# Patient Record
Sex: Female | Born: 1980 | Race: Black or African American | Hispanic: No | Marital: Single | Smoking: Current some day smoker
Health system: Southern US, Community
[De-identification: ages and names within clinical notes are randomized; demographics above are authoritative.]

## PROBLEM LIST (undated history)

## (undated) HISTORY — PX: FRACTURE SURGERY: SHX138

---

## 2015-03-19 DIAGNOSIS — R5383 Other fatigue: Secondary | ICD-10-CM | POA: Diagnosis not present

## 2015-03-19 DIAGNOSIS — G43829 Menstrual migraine, not intractable, without status migrainosus: Secondary | ICD-10-CM | POA: Diagnosis not present

## 2015-04-08 DIAGNOSIS — R5383 Other fatigue: Secondary | ICD-10-CM | POA: Diagnosis not present

## 2015-04-08 DIAGNOSIS — E559 Vitamin D deficiency, unspecified: Secondary | ICD-10-CM | POA: Diagnosis not present

## 2015-04-08 DIAGNOSIS — G43829 Menstrual migraine, not intractable, without status migrainosus: Secondary | ICD-10-CM | POA: Diagnosis not present

## 2015-04-08 DIAGNOSIS — E780 Pure hypercholesterolemia: Secondary | ICD-10-CM | POA: Diagnosis not present

## 2015-11-19 ENCOUNTER — Emergency Department (HOSPITAL_COMMUNITY): Payer: Medicare Other

## 2015-11-19 ENCOUNTER — Encounter (HOSPITAL_COMMUNITY): Payer: Self-pay | Admitting: *Deleted

## 2015-11-19 ENCOUNTER — Emergency Department (HOSPITAL_COMMUNITY)
Admission: EM | Admit: 2015-11-19 | Discharge: 2015-11-19 | Disposition: A | Discharge status: Home / Self Care | Payer: Medicare Other | Attending: Emergency Medicine | Admitting: Emergency Medicine

## 2015-11-19 DIAGNOSIS — E669 Obesity, unspecified: Secondary | ICD-10-CM | POA: Diagnosis not present

## 2015-11-19 DIAGNOSIS — F17210 Nicotine dependence, cigarettes, uncomplicated: Secondary | ICD-10-CM | POA: Diagnosis not present

## 2015-11-19 DIAGNOSIS — R1084 Generalized abdominal pain: Secondary | ICD-10-CM | POA: Diagnosis not present

## 2015-11-19 DIAGNOSIS — R109 Unspecified abdominal pain: Secondary | ICD-10-CM

## 2015-11-19 DIAGNOSIS — R112 Nausea with vomiting, unspecified: Secondary | ICD-10-CM | POA: Insufficient documentation

## 2015-11-19 DIAGNOSIS — Z3202 Encounter for pregnancy test, result negative: Secondary | ICD-10-CM | POA: Diagnosis not present

## 2015-11-19 LAB — URINE MICROSCOPIC-ADD ON
RBC / HPF: NONE SEEN RBC/hpf (ref 0–5)
WBC, UA: NONE SEEN WBC/hpf (ref 0–5)

## 2015-11-19 LAB — COMPREHENSIVE METABOLIC PANEL WITH GFR
ALT: 15 U/L (ref 14–54)
AST: 20 U/L (ref 15–41)
Albumin: 4 g/dL (ref 3.5–5.0)
Alkaline Phosphatase: 52 U/L (ref 38–126)
Anion gap: 9 (ref 5–15)
BUN: 17 mg/dL (ref 6–20)
CO2: 27 mmol/L (ref 22–32)
Calcium: 9.3 mg/dL (ref 8.9–10.3)
Chloride: 103 mmol/L (ref 101–111)
Creatinine, Ser: 0.57 mg/dL (ref 0.44–1.00)
GFR calc Af Amer: >60 mL/min (ref >60)
GFR calc non Af Amer: >60 mL/min (ref >60)
Glucose, Bld: 99 mg/dL (ref 65–99)
Potassium: 4.1 mmol/L (ref 3.5–5.1)
Sodium: 139 mmol/L (ref 135–145)
Total Bilirubin: 0.5 mg/dL (ref 0.3–1.2)
Total Protein: 7.7 g/dL (ref 6.5–8.1)

## 2015-11-19 LAB — CBC WITH DIFFERENTIAL/PLATELET
Basophils Absolute: 0 K/uL (ref 0.0–0.1)
Basophils Relative: 0 %
Eosinophils Absolute: 0 K/uL (ref 0.0–0.7)
Eosinophils Relative: 0 %
HCT: 38.5 % (ref 36.0–46.0)
Hemoglobin: 12.7 g/dL (ref 12.0–15.0)
Lymphocytes Relative: 14 %
Lymphs Abs: 1.7 K/uL (ref 0.7–4.0)
MCH: 31.1 pg (ref 26.0–34.0)
MCHC: 33 g/dL (ref 30.0–36.0)
MCV: 94.1 fL (ref 78.0–100.0)
Monocytes Absolute: 0.9 K/uL (ref 0.1–1.0)
Monocytes Relative: 8 %
Neutro Abs: 9.6 K/uL — ABNORMAL HIGH (ref 1.7–7.7)
Neutrophils Relative %: 78 %
Platelets: 342 K/uL (ref 150–400)
RBC: 4.09 MIL/uL (ref 3.87–5.11)
RDW: 12.6 % (ref 11.5–15.5)
WBC: 12.2 K/uL — ABNORMAL HIGH (ref 4.0–10.5)

## 2015-11-19 LAB — PREGNANCY, URINE: Preg Test, Ur: NEGATIVE

## 2015-11-19 LAB — URINALYSIS, ROUTINE W REFLEX MICROSCOPIC
Bilirubin Urine: NEGATIVE
GLUCOSE, UA: NEGATIVE mg/dL
Hgb urine dipstick: NEGATIVE
Ketones, ur: NEGATIVE mg/dL
LEUKOCYTES UA: NEGATIVE
NITRITE: NEGATIVE
PH: 8.5 — AB (ref 5.0–8.0)
SPECIFIC GRAVITY, URINE: 1.015 (ref 1.005–1.030)

## 2015-11-19 LAB — LIPASE, BLOOD: Lipase: 10 U/L — ABNORMAL LOW (ref 11–51)

## 2015-11-19 MED ORDER — PROMETHAZINE HCL 25 MG/ML IJ SOLN
25.0000 mg | Freq: Once | INTRAMUSCULAR | Status: AC
  Administered 2015-11-19: 25 mg via INTRAVENOUS
  Filled 2015-11-19: qty 1

## 2015-11-19 MED ORDER — MORPHINE SULFATE (PF) 4 MG/ML IV SOLN
4.0000 mg | Freq: Once | INTRAVENOUS | Status: AC
  Administered 2015-11-19: 4 mg via INTRAVENOUS
  Filled 2015-11-19: qty 1

## 2015-11-19 MED ORDER — SODIUM CHLORIDE 0.9 % IV BOLUS (SEPSIS)
1000.0000 mL | Freq: Once | INTRAVENOUS | Status: AC
  Administered 2015-11-19: 1000 mL via INTRAVENOUS

## 2015-11-19 MED ORDER — ONDANSETRON HCL 4 MG PO TABS: 4.0000 mg | ORAL_TABLET | Freq: Four times a day (QID) | ORAL | Status: DC

## 2015-11-19 MED ORDER — DIPHENHYDRAMINE HCL 50 MG/ML IJ SOLN
12.5000 mg | Freq: Once | INTRAMUSCULAR | Status: AC
  Administered 2015-11-19: 12.5 mg via INTRAVENOUS
  Filled 2015-11-19: qty 1

## 2015-11-19 MED ORDER — ONDANSETRON HCL 4 MG/2ML IJ SOLN
4.0000 mg | Freq: Once | INTRAMUSCULAR | Status: AC
  Administered 2015-11-19: 4 mg via INTRAVENOUS
  Filled 2015-11-19: qty 2

## 2015-11-19 MED ORDER — KETOROLAC TROMETHAMINE 30 MG/ML IJ SOLN
30.0000 mg | Freq: Once | INTRAMUSCULAR | Status: AC
  Administered 2015-11-19: 30 mg via INTRAVENOUS
  Filled 2015-11-19: qty 1

## 2015-11-19 MED ORDER — IOHEXOL 300 MG/ML  SOLN
100.0000 mL | Freq: Once | INTRAMUSCULAR | Status: AC | PRN
  Administered 2015-11-19: 100 mL via INTRAVENOUS

## 2015-11-19 NOTE — ED Notes (Signed)
Pt vomiting green substance at this time.  Notified Dr Manus Gunningancour with new orders

## 2015-11-19 NOTE — ED Notes (Signed)
Pt vomited liquid that she had drank. Conts to complain of abd pain

## 2015-11-19 NOTE — ED Provider Notes (Signed)
Patient seen and evaluated. Has some additional nausea. Remedicated. Given additional fluids. Able to take some by mouth ice chips, sips of water, and some crackers. Possible sludge in gallbladder noted on CT, otherwise negative. Has no abnormal LFTs. Does not have localized right upper quadrant pain or recent history of fatty food intolerance or biliary colic symptoms. His symptoms most consistent with likely acute viral etiology and enteritis. Plan is home, antiemetics, clear liquids. Advance diet, ER with acute changes.  Rolland PorterMark Charbel Los, MD 11/19/15 [phone]

## 2015-11-19 NOTE — ED Notes (Signed)
Pt given sprite for fluid challenge °

## 2015-11-19 NOTE — ED Notes (Signed)
Pt ambulated to BR for urine specimen 

## 2015-11-19 NOTE — Discharge Instructions (Signed)

## 2015-11-19 NOTE — ED Provider Notes (Signed)
CSN: [id number]     Arrival date & time 11/19/15  1054 History  By signing my name below, I, Marica Otter, attest that this documentation has been prepared under the direction and in the presence of Glynn Octave, MD. Electronically Signed: Marica Otter, ED Scribe. 11/19/2015. 11:49 AM.  Chief Complaint  Patient presents with  . Abdominal Pain   The history is provided by the patient. No language interpreter was used.   PCP: No primary care provider on file. HPI Comments: Andrea Lopez is a 34 y.o. female, with PMHx noted below, who presents to the Emergency Department complaining of generalized cramping abd pain with associated n/v onset last night-- pt notes her Sx began with n/v and the abd pain followed. Reports she ate some chitlins last night but no one else has been sick. Pt reports 6 episodes of vomiting since onset. Pt confirms streaks of blood in her vomit. Pt denies sick contacts. Pt denies diarrhea, blood in urine, chest pain, SOB. Pt denies any possibility of pregnancy, LMP was 11/02/15.  Pt denies any chronic health conditions including DM.   History reviewed. No pertinent past medical history. History reviewed. No pertinent past surgical history. History reviewed. No pertinent family history. Social History  Substance Use Topics  . Smoking status: Current Every Day Smoker -- 0.25 packs/day    Types: Cigarettes  . Smokeless tobacco: None  . Alcohol Use: No   OB History    No data available     Review of Systems A complete 10 system review of systems was obtained and all systems are negative except as noted in the HPI and PMH.  Allergies  Review of patient's allergies indicates no known allergies.  Home Medications   Prior to Admission medications   Not on File   Triage Vitals: BP 155/106 mmHg  Pulse 83  Temp(Src) 98.2 F (36.8 C)  Resp 18  Ht  (1.803 m)  Wt 285 lb (129.275 kg)  BMI 39.77 kg/m2  SpO2 100%  LMP 11/02/2015 Physical Exam   Constitutional: She is oriented to person, place, and time. She appears well-developed and well-nourished. No distress.  Obese   HENT:  Head: Normocephalic and atraumatic.  Mouth/Throat: Oropharynx is clear and moist. Mucous membranes are dry. No oropharyngeal exudate.  Eyes: Conjunctivae and EOM are normal. Pupils are equal, round, and reactive to light.  Neck: Normal range of motion. Neck supple.  No meningismus.  Cardiovascular: Normal rate, regular rhythm, normal heart sounds and intact distal pulses.   No murmur heard. Pulmonary/Chest: Effort normal and breath sounds normal. No respiratory distress.  Abdominal: Soft. There is tenderness (diffused abd tenderness). There is no rebound, no guarding and no CVA tenderness.  Musculoskeletal: Normal range of motion. She exhibits no edema or tenderness.  Neurological: She is alert and oriented to person, place, and time. No cranial nerve deficit. She exhibits normal muscle tone. Coordination normal.  No ataxia on finger to nose bilaterally. No pronator drift. 5/5 strength throughout. CN 2-12 intact.Equal grip strength. Sensation intact.   Skin: Skin is warm.  Psychiatric: She has a normal mood and affect. Her behavior is normal.  Nursing note and vitals reviewed.   ED Course  Procedures (including critical care time) DIAGNOSTIC STUDIES: Oxygen Saturation is 100% on ra, nl by my interpretation.    COORDINATION OF CARE: 11:46 AM: Discussed treatment plan which includes meds and labs with pt at bedside; patient verbalizes understanding and agrees with treatment plan.  Labs Review Labs Reviewed  URINALYSIS, ROUTINE W REFLEX MICROSCOPIC (NOT [facility]) - Abnormal; Notable for the following:    pH 8.5 (*)    Protein, ur TRACE (*)    All other components within normal limits  CBC WITH DIFFERENTIAL/PLATELET - Abnormal; Notable for the following:    WBC 12.2 (*)    Neutro Abs 9.6 (*)    All other components within normal limits  LIPASE,  BLOOD - Abnormal; Notable for the following:    Lipase 10 (*)    All other components within normal limits  URINE MICROSCOPIC-ADD ON - Abnormal; Notable for the following:    Squamous Epithelial / LPF 0-5 (*)    Bacteria, UA RARE (*)    All other components within normal limits  PREGNANCY, URINE  COMPREHENSIVE METABOLIC PANEL   I have personally reviewed and evaluated these lab results as part of my medical decision-making.  MDM   Final diagnoses:  Nausea and vomiting, vomiting of unspecified type  Abdominal pain, unspecified abdominal location   nausea and abdominal pain since last night. No diarrhea. Disease some streaks of red in her emesis. No fever.  Vitals are stable. Abdomen is soft and nontender without peritoneal signs. Urinalysis is negative. White count 12.  Patient given IV fluids, IV pain medication and antiemetics.  Continues to have some vomiting in the ED. Abdominal exam remains benign. Given ongoing pain and discomfort will also obtain CT scan. Care will be signed out to Dr. Fayrene FearingJames at change of shift.  I personally performed the services described in this documentation, which was scribed in my presence. The recorded information has been reviewed and is accurate.     Glynn OctaveStephen Kimya Mccahill, MD 11/19/15 [phone]

## 2015-11-19 NOTE — ED Notes (Signed)
Pt vomited approx 200 ml of liquid.

## 2015-11-19 NOTE — ED Notes (Signed)
Pt reports nausea has passed. No vomiting at this time. Complaining of abdominal pain. Dr Manus Gunningancour notified

## 2015-11-19 NOTE — ED Notes (Signed)
Pt reports abd pain with n/v that started yesterday. Denies diarrhea.

## 2016-01-10 ENCOUNTER — Emergency Department: Payer: Medicare Other

## 2016-01-10 ENCOUNTER — Emergency Department
Admission: EM | Admit: 2016-01-10 | Discharge: 2016-01-10 | Disposition: A | Discharge status: Home / Self Care | Payer: Medicare Other | Attending: Emergency Medicine | Admitting: Emergency Medicine

## 2016-01-10 ENCOUNTER — Encounter: Payer: Self-pay | Admitting: Emergency Medicine

## 2016-01-10 DIAGNOSIS — Z3202 Encounter for pregnancy test, result negative: Secondary | ICD-10-CM | POA: Diagnosis not present

## 2016-01-10 DIAGNOSIS — Z79899 Other long term (current) drug therapy: Secondary | ICD-10-CM | POA: Diagnosis not present

## 2016-01-10 DIAGNOSIS — R1084 Generalized abdominal pain: Secondary | ICD-10-CM | POA: Diagnosis present

## 2016-01-10 DIAGNOSIS — F17210 Nicotine dependence, cigarettes, uncomplicated: Secondary | ICD-10-CM | POA: Insufficient documentation

## 2016-01-10 DIAGNOSIS — K819 Cholecystitis, unspecified: Secondary | ICD-10-CM | POA: Diagnosis not present

## 2016-01-10 DIAGNOSIS — K810 Acute cholecystitis: Secondary | ICD-10-CM | POA: Insufficient documentation

## 2016-01-10 LAB — URINALYSIS COMPLETE WITH MICROSCOPIC (ARMC ONLY)
BACTERIA UA: NONE SEEN
GLUCOSE, UA: NEGATIVE mg/dL
KETONES UR: NEGATIVE mg/dL
Leukocytes, UA: NEGATIVE
NITRITE: NEGATIVE
Protein, ur: 30 mg/dL — AB
SPECIFIC GRAVITY, URINE: 1.029 (ref 1.005–1.030)
pH: 6 (ref 5.0–8.0)

## 2016-01-10 LAB — COMPREHENSIVE METABOLIC PANEL
ALBUMIN: 3.3 g/dL — AB (ref 3.5–5.0)
ALT: 113 U/L — ABNORMAL HIGH (ref 14–54)
AST: 278 U/L — ABNORMAL HIGH (ref 15–41)
Alkaline Phosphatase: 80 U/L (ref 38–126)
Anion gap: 7 (ref 5–15)
BILIRUBIN TOTAL: 1.9 mg/dL — AB (ref 0.3–1.2)
BUN: 12 mg/dL (ref 6–20)
CALCIUM: 8.4 mg/dL — AB (ref 8.9–10.3)
CO2: 26 mmol/L (ref 22–32)
Chloride: 103 mmol/L (ref 101–111)
Creatinine, Ser: 0.59 mg/dL (ref 0.44–1.00)
GFR calc Af Amer: >60 mL/min (ref >60)
GFR calc non Af Amer: >60 mL/min (ref >60)
GLUCOSE: 121 mg/dL — AB (ref 65–99)
Potassium: 3.8 mmol/L (ref 3.5–5.1)
Sodium: 136 mmol/L (ref 135–145)
TOTAL PROTEIN: 7.1 g/dL (ref 6.5–8.1)

## 2016-01-10 LAB — CBC
HCT: 33.8 % — ABNORMAL LOW (ref 35.0–47.0)
Hemoglobin: 11.7 g/dL — ABNORMAL LOW (ref 12.0–16.0)
MCH: 31.2 pg (ref 26.0–34.0)
MCHC: 34.5 g/dL (ref 32.0–36.0)
MCV: 90.5 fL (ref 80.0–100.0)
Platelets: 277 10*3/uL (ref 150–440)
RBC: 3.74 MIL/uL — ABNORMAL LOW (ref 3.80–5.20)
RDW: 12.8 % (ref 11.5–14.5)
WBC: 11.9 10*3/uL — ABNORMAL HIGH (ref 3.6–11.0)

## 2016-01-10 LAB — POCT PREGNANCY, URINE: PREG TEST UR: NEGATIVE

## 2016-01-10 LAB — LIPASE, BLOOD: Lipase: 16 U/L (ref 11–51)

## 2016-01-10 MED ORDER — HYDROMORPHONE HCL 1 MG/ML IJ SOLN
1.0000 mg | Freq: Once | INTRAMUSCULAR | Status: DC
  Filled 2016-01-10: qty 1

## 2016-01-10 MED ORDER — PROMETHAZINE HCL 25 MG/ML IJ SOLN
12.5000 mg | Freq: Once | INTRAMUSCULAR | Status: DC
  Filled 2016-01-10: qty 1

## 2016-01-10 MED ORDER — SODIUM CHLORIDE 0.9 % IV BOLUS (SEPSIS)
1000.0000 mL | Freq: Once | INTRAVENOUS | Status: AC
  Administered 2016-01-10: 1000 mL via INTRAVENOUS

## 2016-01-10 MED ORDER — HYDROCODONE-ACETAMINOPHEN 5-325 MG PO TABS: 1.0000 | ORAL_TABLET | Freq: Four times a day (QID) | ORAL | Status: DC | PRN

## 2016-01-10 MED ORDER — ONDANSETRON 4 MG PO TBDP
ORAL_TABLET | ORAL | Status: AC
  Administered 2016-01-10: 4 mg via ORAL
  Filled 2016-01-10: qty 1

## 2016-01-10 MED ORDER — ONDANSETRON 4 MG PO TBDP
4.0000 mg | ORAL_TABLET | Freq: Once | ORAL | Status: AC
  Administered 2016-01-10: 4 mg via ORAL

## 2016-01-10 MED ORDER — PROMETHAZINE HCL 12.5 MG PO TABS: 12.5000 mg | ORAL_TABLET | Freq: Four times a day (QID) | ORAL | Status: DC | PRN

## 2016-01-10 MED ORDER — IOHEXOL 300 MG/ML  SOLN
100.0000 mL | Freq: Once | INTRAMUSCULAR | Status: AC | PRN
  Administered 2016-01-10: 100 mL via INTRAVENOUS

## 2016-01-10 MED ORDER — IOHEXOL 240 MG/ML SOLN
25.0000 mL | Freq: Once | INTRAMUSCULAR | Status: AC | PRN
  Administered 2016-01-10: 25 mL via ORAL

## 2016-01-10 NOTE — ED Notes (Signed)
Pt awoke at this time, provided urine sample and drank contrast. CT notified, will take pt momentarily.

## 2016-01-10 NOTE — ED Notes (Signed)
Patient transported to CT 

## 2016-01-10 NOTE — ED Notes (Addendum)
MD Huel Cote at bedside. Pt states "i need to leave, i need to pick my daughter up, i dont have time for this"

## 2016-01-10 NOTE — ED Provider Notes (Signed)
Time Seen: Approximately 1045  I have reviewed the triage notes  Chief Complaint: Abdominal Pain   History of Present Illness: Andrea Lopez is a 35 y.o. female who presents with acute onset of some diffuse abdominal cramping with nausea vomiting 2. Patient states her symptoms started at 6:30 this morning. She had normal female last night and denies any diarrhea at this time. She denies any melena or hematochezia. Patient's having some persistent vomiting during her initial assessment and points primarily toward the epigastric. She denies any hematemesis or biliary emesis. She denies any dysuria, hematuria or urinary frequency. Patient states she doesn't feel his iris that she's pregnant.  History reviewed. No pertinent past medical history.  There are no active problems to display for this patient.   History reviewed. No pertinent past surgical history.  History reviewed. No pertinent past surgical history.  Current Outpatient Rx  Name  Route  Sig  Dispense  Refill  . HYDROcodone-acetaminophen (NORCO) 5-325 MG tablet   Oral   Take 1 tablet by mouth every 6 (six) hours as needed for moderate pain.   20 tablet   0   . ondansetron (ZOFRAN) 4 MG tablet   Oral   Take 1 tablet (4 mg total) by mouth every 6 (six) hours.   12 tablet   0   . promethazine (PHENERGAN) 12.5 MG tablet   Oral   Take 1 tablet (12.5 mg total) by mouth every 6 (six) hours as needed for nausea or vomiting.   30 tablet   0     Allergies:  Review of patient's allergies indicates no known allergies.  Family History: History reviewed. No pertinent family history.  Social History: Social History  Substance Use Topics  . Smoking status: Current Every Day Smoker -- 0.25 packs/day    Types: Cigarettes  . Smokeless tobacco: None  . Alcohol Use: No     Review of Systems:   10 point review of systems was performed and was otherwise negative:  Constitutional: No fever Eyes: No visual  disturbances ENT: No sore throat, ear pain Cardiac: No chest pain Respiratory: No shortness of breath, wheezing, or stridor Abdomen: Abdominal pain is crampy and diffuse. Endocrine: No weight loss, No night sweats Extremities: No peripheral edema, cyanosis Skin: No rashes, easy bruising Neurologic: No focal weakness, trouble with speech or swollowing Urologic: No dysuria, Hematuria, or urinary frequency   Physical Exam:  ED Triage Vitals  Enc Vitals Group     BP 01/10/16 0918 114/67 mmHg     Pulse Rate 01/10/16 0918 86     Resp 01/10/16 0918 18     Temp 01/10/16 0918 98 F (36.7 C)     Temp Source 01/10/16 0918 Oral     SpO2 01/10/16 0918 96 %     Weight --      Height --      Head Cir --      Peak Flow --      Pain Score 01/10/16 0919 10     Pain Loc --      Pain Edu? --      Excl. in GC? --     General: Awake , Alert , and Oriented times 3; GCS 15 Head: Normal cephalic , atraumatic Eyes: Pupils equal , round, reactive to light Nose/Throat: No nasal drainage, patent upper airway without erythema or exudate.  Neck: Supple, Full range of motion, No anterior adenopathy or palpable thyroid masses Lungs: Clear to ascultation without wheezes ,  rhonchi, or rales Heart: Regular rate, regular rhythm without murmurs , gallops , or rubs Abdmild tenderness primarily over the epigastric and lower middle quadrant without any rebound guarding rigidity. Negative Murphy's sign.      Extremities: 2 plus symmetric pulses. No edema, clubbing or cyanosis Neurologic: normal ambulation, Motor symmetric without deficits, sensory intact Skin: warm, dry, no rashes   Labs:   All laboratory work was reviewed including any pertinent negatives or positives listed below:  Labs Reviewed  COMPREHENSIVE METABOLIC PANEL - Abnormal; Notable for the following:    Glucose, Bld 121 (*)    Calcium 8.4 (*)    Albumin 3.3 (*)    AST 278 (*)    ALT 113 (*)    Total Bilirubin 1.9 (*)    All other  components within normal limits  CBC - Abnormal; Notable for the following:    WBC 11.9 (*)    RBC 3.74 (*)    Hemoglobin 11.7 (*)    HCT 33.8 (*)    All other components within normal limits  URINALYSIS COMPLETEWITH MICROSCOPIC ([facility] ONLY) - Abnormal; Notable for the following:    Color, Urine AMBER (*)    APPearance HAZY (*)    Bilirubin Urine 1+ (*)    Hgb urine dipstick 3+ (*)    Protein, ur 30 (*)    Squamous Epithelial / LPF 0-5 (*)    All other components within normal limits  LIPASE, BLOOD  POC URINE PREG, ED  POCT PREGNANCY, URINE   reviewed the patient's laboratory work shows slightly elevated white blood cell count. She does have elevated LFTs and total bilirubin.   *   Radiology:    CLINICAL DATA: Pt reports abd pain that started around 630 this morning. Vomiting x 2. C/o epigastric pain and back pain. Last ate last night.  EXAM: CT ABDOMEN AND PELVIS WITH CONTRAST  TECHNIQUE: Multidetector CT imaging of the abdomen and pelvis was performed using the standard protocol following bolus administration of intravenous contrast.  CONTRAST: OMNIPAQUE IOHEXOL 300 MG/ML SOLN  COMPARISON: 11/19/2015.  FINDINGS: Lung bases: Minor subsegmental atelectasis. Otherwise clear. Heart normal size.  Hepatobiliary: Gallbladder is distended with wall thickening and pericholecystic inflammation. A subtle density projects in the region of the gallbladder neck or cystic duct, suggesting a stone. There is edema in the porta hepatis mild prominence of the central intrahepatic bile ducts. The common bile duct is normal in caliber. The liver is unremarkable.  Spleen, pancreas, adrenal glands: Normal.  Kidneys, ureters, bladder: Unremarkable.  Uterus and adnexa: Normal.  Lymph nodes: Several prominent gastrohepatic ligament nodes, but no pathologically enlarged lymph nodes by size criteria. No other prominent nodes.  Ascites: None.  Gastrointestinal:  Unremarkable. Appendix not definitively seen, but no evidence appendicitis.  Musculoskeletal: Slight anterolisthesis of L4 on L5 due to facet degenerative change. No osteoblastic or osteolytic lesions.  IMPRESSION: 1. Acute cholecystitis. 2. No other findings.     I personally reviewed the radiologic studies   P    ED Course:  The patient appears to have acute cholecystitis. CAT scan. She was given anti-medic therapy and pain medication was offered here in emergency department but her pain seemed to spontaneously resolve. He was able tolerate some oral fluids. Patient was advised that she may need surgical consultation but she is insistent that she needs to leave to go pick up her daughter. The exam of her abdomen shows no peritoneal signs and I agreed to discharge her with pain medication  but advised her to have a low threshold for returning here to emergency department. I was going to contact general surgery and have the patient seen here in emergency department. Her decision is reasonable low since her pain is now spontaneously resolved and she is currently afebrile.     Assessment: * Acute cholecystitis    Final Clinical Impression:  Final diagnoses:  Cholecystitis     Plan: *  patient was referred for outpatient surgery and advised to return here especially if she has uncontrolled pain, fever, persistent vomiting or any other concerns. She was prescribed Norco for pain and also given Phenergan for anti-medic therapy           Jennye Moccasin, MD 01/10/16 1708

## 2016-01-10 NOTE — ED Notes (Addendum)
Pt reports abd pain that started around 630 this morning. Vomiting x 2. C/o epigastric pain and back pain.  Last ate last night.

## 2016-01-10 NOTE — Discharge Instructions (Signed)
Cholecystitis Cholecystitis is inflammation of the gallbladder. It is often called a gallbladder attack. The gallbladder is a pear-shaped organ that lies beneath the liver on the right side of the body. The gallbladder stores bile, which is a fluid that helps the body to digest fats. If bile builds up in your gallbladder, your gallbladder becomes inflamed. This condition may occur suddenly (be acute). Repeat episodes of acute cholecystitis or prolonged episodes may lead to a long-term (chronic) condition. Cholecystitis is serious and it requires treatment.  CAUSES The most common cause of this condition is gallstones. Gallstones can block the tube (duct) that carries bile out of your gallbladder. This causes bile to build up. Other causes of this condition include:  Damage to the gallbladder due to a decrease in blood flow.  Infections in the bile ducts.  Scars or kinks in the bile ducts.  Tumors in the liver, pancreas, or gallbladder. RISK FACTORS This condition is more likely to develop in:  People who have sickle cell disease.  People who take birth control pills or use estrogen.  People who have alcoholic liver disease.  People who have liver cirrhosis.  People who have their nutrition delivered through a vein (parenteral nutrition).  People who do not eat or drink (do fasting) for a long period of time.  People who are obese.  People who have rapid weight loss.  People who are pregnant.  People who have increased triglyceride levels.  People who have pancreatitis. SYMPTOMS Symptoms of this condition include:  Abdominal pain, especially in the upper right area of the abdomen.  Abdominal tenderness or bloating.  Nausea.  Vomiting.  Fever.  Chills.  Yellowing of the skin and the whites of the eyes (jaundice). DIAGNOSIS This condition is diagnosed with a medical history and physical exam. You may also have other tests, including:  Imaging tests, such as:  An  ultrasound of the gallbladder.  A CT scan of the abdomen.  A gallbladder nuclear scan (HIDA scan). This scan allows your health care provider to see the bile moving from your liver to your gallbladder and to your small intestine.  MRI.  Blood tests, such as:  A complete blood count, because the white blood cell count may be higher than normal.  Liver function tests, because some levels may be higher than normal with certain types of gallstones. TREATMENT Treatment may include:  Fasting for a certain amount of time.  IV fluids.  Medicine to treat pain or vomiting.  Antibiotic medicine.  Surgery to remove your gallbladder (cholecystectomy). This may happen immediately or at a later time. HOME CARE INSTRUCTIONS Home care will depend on your treatment. In general:  Take over-the-counter and prescription medicines only as told by your health care provider.  If you were prescribed an antibiotic medicine, take it as told by your health care provider. Do not stop taking the antibiotic even if you start to feel better.  Follow instructions from your health care provider about what to eat or drink. When you are allowed to eat, avoid eating or drinking anything that triggers your symptoms.  Keep all follow-up visits as told by your health care provider. This is important. SEEK MEDICAL CARE IF:  Your pain is not controlled with medicine.  You have a fever. SEEK IMMEDIATE MEDICAL CARE IF:  Your pain moves to another part of your abdomen or to your back.  You continue to have symptoms or you develop new symptoms even with treatment.   This information  is not intended to replace advice given to you by your health care provider. Make sure you discuss any questions you have with your health care provider.   Document Released: 11/09/2005 Document Revised: 07/31/2015 Document Reviewed: 02/20/2015 Elsevier Interactive Patient Education [employer]  Please return immediately if  condition worsens. Please contact her primary physician or the physician you were given for referral. If you have any specialist physicians involved in her treatment and plan please also contact them. Thank you for using [facility]. Please contact the surgical group on Monday and a low-fat diet. Return for fever, uncontrolled pain, uncontrolled vomiting

## 2016-01-10 NOTE — ED Notes (Signed)
Pt sleeping soundly at this time. NAD noted, will continue to monitor.

## 2016-01-10 NOTE — ED Notes (Addendum)
Pt pulled own IV out at this time, states "i dont have time to wait" as RN was entering room to take IV out. Pt did not allow for RN to retake vitals upon discharge.

## 2016-01-29 ENCOUNTER — Ambulatory Visit: Payer: Self-pay | Admitting: Surgery

## 2016-01-29 ENCOUNTER — Ambulatory Visit: Payer: Medicaid Other | Admitting: Surgery

## 2017-01-02 ENCOUNTER — Encounter (HOSPITAL_COMMUNITY): Payer: Self-pay | Admitting: Emergency Medicine

## 2017-01-02 ENCOUNTER — Emergency Department (HOSPITAL_COMMUNITY): Payer: Medicare Other

## 2017-01-02 ENCOUNTER — Emergency Department (HOSPITAL_COMMUNITY)
Admission: EM | Admit: 2017-01-02 | Discharge: 2017-01-02 | Disposition: A | Discharge status: Home / Self Care | Payer: Medicare Other | Attending: Emergency Medicine | Admitting: Emergency Medicine

## 2017-01-02 DIAGNOSIS — F17210 Nicotine dependence, cigarettes, uncomplicated: Secondary | ICD-10-CM | POA: Insufficient documentation

## 2017-01-02 DIAGNOSIS — R51 Headache: Secondary | ICD-10-CM | POA: Diagnosis not present

## 2017-01-02 DIAGNOSIS — R0602 Shortness of breath: Secondary | ICD-10-CM | POA: Diagnosis not present

## 2017-01-02 DIAGNOSIS — R110 Nausea: Secondary | ICD-10-CM | POA: Insufficient documentation

## 2017-01-02 DIAGNOSIS — R6883 Chills (without fever): Secondary | ICD-10-CM | POA: Diagnosis not present

## 2017-01-02 DIAGNOSIS — R6889 Other general symptoms and signs: Secondary | ICD-10-CM

## 2017-01-02 DIAGNOSIS — J111 Influenza due to unidentified influenza virus with other respiratory manifestations: Secondary | ICD-10-CM | POA: Diagnosis not present

## 2017-01-02 LAB — URINALYSIS, ROUTINE W REFLEX MICROSCOPIC
Bilirubin Urine: NEGATIVE
Glucose, UA: NEGATIVE mg/dL
Hgb urine dipstick: NEGATIVE
Ketones, ur: NEGATIVE mg/dL
Leukocytes, UA: NEGATIVE
Nitrite: NEGATIVE
Protein, ur: NEGATIVE mg/dL
Specific Gravity, Urine: 1.013 (ref 1.005–1.030)
pH: 7 (ref 5.0–8.0)

## 2017-01-02 LAB — PREGNANCY, URINE: Preg Test, Ur: NEGATIVE

## 2017-01-02 MED ORDER — SODIUM CHLORIDE 0.9 % IV BOLUS (SEPSIS)
1000.0000 mL | Freq: Once | INTRAVENOUS | Status: AC
  Administered 2017-01-02: 1000 mL via INTRAVENOUS

## 2017-01-02 MED ORDER — BENZONATATE 100 MG PO CAPS: 100.0000 mg | ORAL_CAPSULE | Freq: Three times a day (TID) | ORAL | 0 refills | Status: AC

## 2017-01-02 MED ORDER — ONDANSETRON HCL 4 MG PO TABS: 4.0000 mg | ORAL_TABLET | Freq: Four times a day (QID) | ORAL | 0 refills | Status: AC

## 2017-01-02 MED ORDER — PROCHLORPERAZINE EDISYLATE 5 MG/ML IJ SOLN
10.0000 mg | Freq: Once | INTRAMUSCULAR | Status: AC
  Administered 2017-01-02: 10 mg via INTRAVENOUS
  Filled 2017-01-02: qty 2

## 2017-01-02 MED ORDER — ALBUTEROL SULFATE (2.5 MG/3ML) 0.083% IN NEBU
5.0000 mg | INHALATION_SOLUTION | Freq: Once | RESPIRATORY_TRACT | Status: AC
  Administered 2017-01-02: 5 mg via RESPIRATORY_TRACT
  Filled 2017-01-02: qty 6

## 2017-01-02 MED ORDER — DIPHENHYDRAMINE HCL 50 MG/ML IJ SOLN
25.0000 mg | Freq: Once | INTRAMUSCULAR | Status: AC
  Administered 2017-01-02: 25 mg via INTRAVENOUS
  Filled 2017-01-02: qty 1

## 2017-01-02 MED ORDER — ALBUTEROL SULFATE HFA 108 (90 BASE) MCG/ACT IN AERS
1.0000 | INHALATION_SPRAY | Freq: Once | RESPIRATORY_TRACT | Status: AC
Start: 2017-01-02 — End: 2017-01-02
  Administered 2017-01-02: 2 via RESPIRATORY_TRACT
  Filled 2017-01-02: qty 6.7

## 2017-01-02 MED ORDER — KETOROLAC TROMETHAMINE 30 MG/ML IJ SOLN
30.0000 mg | Freq: Once | INTRAMUSCULAR | Status: AC
  Administered 2017-01-02: 30 mg via INTRAVENOUS
  Filled 2017-01-02: qty 1

## 2017-01-02 NOTE — ED Notes (Signed)
Took 600mg  Ibuprofen at home without relief of HA, here for complaints of body aches and chills and nausea.  Denies emesis.  States co workers have the flu

## 2017-01-02 NOTE — ED Triage Notes (Signed)
Pt states she started having nausea, headache, and chills this morning

## 2017-01-02 NOTE — Discharge Instructions (Signed)
Medications: Zofran, Tessalon, albuterol inhaler  Treatment: Take Zofran every 6 hours as needed for nausea and vomiting. Take Tessalon every 8 hours as needed for cough. Use albuterol inhaler every 4-6 hours as needed for cough, shortness of breath, and wheezing. You can alternate Tylenol and ibuprofen as prescribed over-the-counter as needed for body aches, headache, fever.  Follow-up: Please follow-up with your primary care provider if your symptoms are not improving over the next 7-10 days. Please return to emergency department if you develop any new or worsening symptoms.

## 2017-01-03 NOTE — ED Provider Notes (Signed)
[facility]-EMERGENCY DEPT Provider Note   CSN: [id number] Arrival date & time: 01/02/17  1857     History   Chief Complaint Chief Complaint  Patient presents with  . Influenza    HPI Andrea Lopez is a 36 y.o. female who was previously healthy who presents with a 1 day history of sudden onset chills, nausea, and progressive headache. Patient has also had some associated mild shortness of breath. Patient denies cough. She denies any vomiting, or abdominal pain. She denies any chest pain. Patient took ibuprofen at home without much relief. Patient states that she works at [employer] and many of the employees are sick with the flu.  HPI  History reviewed. No pertinent past medical history.  There are no active problems to display for this patient.   Past Surgical History:  Procedure Laterality Date  . FRACTURE SURGERY      OB History    No data available       Home Medications    Prior to Admission medications   Medication Sig Start Date End Date Taking? Authorizing Provider  benzonatate (TESSALON) 100 MG capsule Take 1 capsule (100 mg total) by mouth every 8 (eight) hours. 01/02/17   Waylan BogaAlexandra M Jagdeep Ancheta, PA-C  ondansetron (ZOFRAN) 4 MG tablet Take 1 tablet (4 mg total) by mouth every 6 (six) hours. 01/02/17   Emi HolesAlexandra M Starling Jessie, PA-C    Family History History reviewed. No pertinent family history.  Social History Social History  Substance Use Topics  . Smoking status: Current Some Day Smoker    Packs/day: 0.25    Types: Cigarettes  . Smokeless tobacco: Never Used  . Alcohol use No     Allergies   Patient has no known allergies.   Review of Systems Review of Systems  Constitutional: Positive for chills. Negative for fever.  HENT: Negative for facial swelling and sore throat.   Respiratory: Positive for shortness of breath.   Cardiovascular: Negative for chest pain.  Gastrointestinal: Positive for nausea. Negative for abdominal pain and vomiting.  Genitourinary:  Negative for dysuria.  Musculoskeletal: Negative for back pain.  Skin: Negative for rash and wound.  Neurological: Positive for headaches.  Psychiatric/Behavioral: The patient is not nervous/anxious.      Physical Exam Updated Vital Signs BP 125/90   Pulse 72   Temp 97.9 F (36.6 C) (Oral)   Resp 20   Ht 5\' 10"  (1.778 m)   Wt 131.1 kg   LMP 12/20/2016   SpO2 100%   BMI 41.47 kg/m   Physical Exam  Constitutional: She appears well-developed and well-nourished. No distress.  HENT:  Head: Normocephalic and atraumatic.  Mouth/Throat: Oropharynx is clear and moist. No oropharyngeal exudate.  Eyes: Conjunctivae are normal. Pupils are equal, round, and reactive to light. Right eye exhibits no discharge. Left eye exhibits no discharge. No scleral icterus.  Neck: Normal range of motion. Neck supple. No thyromegaly present.  Cardiovascular: Normal rate, regular rhythm, normal heart sounds and intact distal pulses.  Exam reveals no gallop and no friction rub.   No murmur heard. Pulmonary/Chest: Effort normal. No stridor. No respiratory distress. She has wheezes (expiratory). She has no rales.  Abdominal: Soft. Bowel sounds are normal. She exhibits no distension. There is no tenderness. There is no rebound and no guarding.  Musculoskeletal: She exhibits no edema.  Lymphadenopathy:    She has no cervical adenopathy.  Neurological: She is alert. Coordination normal.  Skin: Skin is warm and dry. No rash noted. She is not  diaphoretic. No pallor.  Psychiatric: She has a normal mood and affect.  Nursing note and vitals reviewed.    ED Treatments / Results  Labs (all labs ordered are listed, but only abnormal results are displayed) Labs Reviewed  URINALYSIS, ROUTINE W REFLEX MICROSCOPIC  PREGNANCY, URINE    EKG  EKG Interpretation  Date/Time:  Saturday January 02 2017 21:26:27 EST Ventricular Rate:  81 PR Interval:    QRS Duration: 93 QT Interval:  386 QTC Calculation: 448 R  Axis:   73 Text Interpretation:  Sinus rhythm Confirmed by Deretha Emory  MD, SCOTT [phone]) on 01/02/2017 11:15:51 PM       Radiology Dg Chest 2 View  Result Date: 01/02/2017 CLINICAL DATA:  Shortness of Breath EXAM: CHEST  2 VIEW COMPARISON:  None. FINDINGS: The heart size and mediastinal contours are within normal limits. Both lungs are clear. The visualized skeletal structures are unremarkable. IMPRESSION: No active cardiopulmonary disease. Electronically Signed   By: Alcide Clever M.D.   On: 01/02/2017 21:54    Procedures Procedures (including critical care time)  Medications Ordered in ED Medications  sodium chloride 0.9 % bolus 1,000 mL (0 mLs Intravenous Stopped 01/02/17 2352)  ketorolac (TORADOL) 30 MG/ML injection 30 mg (30 mg Intravenous Given 01/02/17 2129)  prochlorperazine (COMPAZINE) injection 10 mg (10 mg Intravenous Given 01/02/17 2129)  diphenhydrAMINE (BENADRYL) injection 25 mg (25 mg Intravenous Given 01/02/17 2129)  albuterol (PROVENTIL) (2.5 MG/3ML) 0.083% nebulizer solution 5 mg (5 mg Nebulization Given 01/02/17 2149)  albuterol (PROVENTIL HFA;VENTOLIN HFA) 108 (90 Base) MCG/ACT inhaler 1-2 puff (2 puffs Inhalation Given 01/02/17 2331)     Initial Impression / Assessment and Plan / ED Course  I have reviewed the triage vital signs and the nursing notes.  Pertinent labs & imaging results that were available during my care of the patient were reviewed by me and considered in my medical decision making (see chart for details).     Patient with symptoms consistent with influenza.  Vitals are stable, low-grade fever.  No signs of dehydration, tolerating PO's.  CXR shows no active cardiopulmonary disease. UA negative. Patient's shortness of breath improved in the ED with albuterol inhaler. Wheezing improved on reevaluation.  Discussed the cost versus benefit of Tamiflu treatment with the patient. Patient declined Tamiflu. Will discharge patient home with Tessalon should a cough  arise, Zofran, albuterol inhaler.  Patient will be discharged with instructions to orally hydrate, rest, and use over-the-counter medications such as anti-inflammatories ibuprofen and Aleve for muscle aches and Tylenol for fever.  Return precautions discussed. Patient understands and agrees with plan. Patient vitals stable throughout ED course and discharged in satisfactory condition.   Final Clinical Impressions(s) / ED Diagnoses   Final diagnoses:  Flu-like symptoms    New Prescriptions Discharge Medication List as of 01/02/2017 11:31 PM    START taking these medications   Details  benzonatate (TESSALON) 100 MG capsule Take 1 capsule (100 mg total) by mouth every 8 (eight) hours., Starting Sat 01/02/2017, Print    ondansetron (ZOFRAN) 4 MG tablet Take 1 tablet (4 mg total) by mouth every 6 (six) hours., Starting Sat 01/02/2017, Print         Emi Holes, PA-C 01/03/17 0105    Vanetta Mulders, MD 01/04/17 [phone]

## 2017-03-25 ENCOUNTER — Encounter (HOSPITAL_COMMUNITY): Payer: Self-pay | Admitting: Emergency Medicine

## 2017-03-25 ENCOUNTER — Emergency Department (HOSPITAL_COMMUNITY)
Admission: EM | Admit: 2017-03-25 | Discharge: 2017-03-25 | Disposition: A | Discharge status: Home / Self Care | Payer: Medicare Other | Attending: Emergency Medicine | Admitting: Emergency Medicine

## 2017-03-25 DIAGNOSIS — Z5321 Procedure and treatment not carried out due to patient leaving prior to being seen by health care provider: Secondary | ICD-10-CM | POA: Insufficient documentation

## 2017-03-25 DIAGNOSIS — F17210 Nicotine dependence, cigarettes, uncomplicated: Secondary | ICD-10-CM | POA: Diagnosis not present

## 2017-03-25 DIAGNOSIS — R197 Diarrhea, unspecified: Secondary | ICD-10-CM | POA: Insufficient documentation

## 2017-03-25 DIAGNOSIS — R1030 Lower abdominal pain, unspecified: Secondary | ICD-10-CM | POA: Insufficient documentation

## 2017-03-25 DIAGNOSIS — R112 Nausea with vomiting, unspecified: Secondary | ICD-10-CM | POA: Insufficient documentation

## 2017-03-25 NOTE — ED Triage Notes (Signed)
PT c/o lower abdominal pain with n/v/d x3 days. PT denies any urinary symptoms or vaginal discharge.

## 2017-03-25 NOTE — ED Notes (Signed)
Pt not in waiting room upon rounding.

## 2017-05-11 ENCOUNTER — Emergency Department (HOSPITAL_COMMUNITY)
Admission: EM | Admit: 2017-05-11 | Discharge: 2017-05-11 | Discharge status: Against Medical Advice | Payer: Medicare Other | Attending: Emergency Medicine | Admitting: Emergency Medicine

## 2017-05-11 ENCOUNTER — Encounter (HOSPITAL_COMMUNITY): Payer: Self-pay | Admitting: Emergency Medicine

## 2017-05-11 DIAGNOSIS — O99331 Smoking (tobacco) complicating pregnancy, first trimester: Secondary | ICD-10-CM | POA: Diagnosis not present

## 2017-05-11 DIAGNOSIS — O26851 Spotting complicating pregnancy, first trimester: Secondary | ICD-10-CM | POA: Diagnosis not present

## 2017-05-11 DIAGNOSIS — F17210 Nicotine dependence, cigarettes, uncomplicated: Secondary | ICD-10-CM | POA: Insufficient documentation

## 2017-05-11 DIAGNOSIS — Z3A08 8 weeks gestation of pregnancy: Secondary | ICD-10-CM | POA: Insufficient documentation

## 2017-05-11 DIAGNOSIS — N939 Abnormal uterine and vaginal bleeding, unspecified: Secondary | ICD-10-CM | POA: Diagnosis not present

## 2017-05-11 LAB — CBC WITH DIFFERENTIAL/PLATELET
BASOS PCT: 0 %
Basophils Absolute: 0 10*3/uL (ref 0.0–0.1)
Eosinophils Absolute: 0.1 10*3/uL (ref 0.0–0.7)
Eosinophils Relative: 1 %
HEMATOCRIT: 36.4 % (ref 36.0–46.0)
HEMOGLOBIN: 11.9 g/dL — AB (ref 12.0–15.0)
LYMPHS PCT: 21 %
Lymphs Abs: 1.5 10*3/uL (ref 0.7–4.0)
MCH: 30.1 pg (ref 26.0–34.0)
MCHC: 32.7 g/dL (ref 30.0–36.0)
MCV: 92.2 fL (ref 78.0–100.0)
MONO ABS: 0.8 10*3/uL (ref 0.1–1.0)
MONOS PCT: 10 %
NEUTROS ABS: 4.8 10*3/uL (ref 1.7–7.7)
NEUTROS PCT: 68 %
Platelets: 331 10*3/uL (ref 150–400)
RBC: 3.95 MIL/uL (ref 3.87–5.11)
RDW: 13.4 % (ref 11.5–15.5)
WBC: 7.2 10*3/uL (ref 4.0–10.5)

## 2017-05-11 LAB — BASIC METABOLIC PANEL
ANION GAP: 7 (ref 5–15)
BUN: 11 mg/dL (ref 6–20)
CALCIUM: 9.2 mg/dL (ref 8.9–10.3)
CHLORIDE: 107 mmol/L (ref 101–111)
CO2: 24 mmol/L (ref 22–32)
Creatinine, Ser: 0.76 mg/dL (ref 0.44–1.00)
GFR calc non Af Amer: >60 mL/min (ref >60)
GLUCOSE: 104 mg/dL — AB (ref 65–99)
Potassium: 3.9 mmol/L (ref 3.5–5.1)
Sodium: 138 mmol/L (ref 135–145)

## 2017-05-11 LAB — I-STAT BETA HCG BLOOD, ED (MC, WL, AP ONLY): I-stat hCG, quantitative: <5 m[IU]/mL (ref <5)

## 2017-05-11 NOTE — ED Notes (Signed)
This RN went to reassess patient and the patient was gone with belongings and gown laying in the chair, edp notified.

## 2017-05-11 NOTE — ED Triage Notes (Signed)
Pt sts was currently [redacted] weeks pregnant with LMP in May; pt sts vaginal bleeding and cramping at present; pt sts G2 P1

## 2017-05-11 NOTE — ED Provider Notes (Signed)
[facility]-EMERGENCY DEPT Provider Note   CSN: [id number] Arrival date & time: 05/11/17  1035  By signing my name below, I, Rosario Adie, attest that this documentation has been prepared under the direction and in the presence of Sharilyn Sites, PA-C.  Electronically Signed: Rosario Adie, ED Scribe. 05/11/17. 12:33 PM.  History   Chief Complaint Chief Complaint  Patient presents with  . Vaginal Bleeding   The history is provided by the patient. No language interpreter was used.    HPI Comments: Andrea Lopez is a G2P1 36 y.o. female who is ~[redacted]wks pregnant, with no pertinent PMHx, who presents to the Emergency Department complaining of intermittent vaginal bleeding beginning this morning. She notes associated cramping lower abdominal pain, nausea, and dysuria secondary to her vaginal bleeding. Her LMP was at the beginning of last month and she notes that she has only taken an at home pregnancy test which was positive at that time. She has not had any other clinical testing or imaging since this to confirm her pregnancy. No noted treatments for her symptoms were tried prior to coming into the ED. She denies fever, vomiting, or any other associated symptoms.   History reviewed. No pertinent past medical history.  There are no active problems to display for this patient.  Past Surgical History:  Procedure Laterality Date  . FRACTURE SURGERY     OB History    Gravida Para Term Preterm AB Living   2         1   SAB TAB Ectopic Multiple Live Births                 Home Medications    Prior to Admission medications   Medication Sig Start Date End Date Taking? Authorizing Provider  benzonatate (TESSALON) 100 MG capsule Take 1 capsule (100 mg total) by mouth every 8 (eight) hours. 01/02/17   Law, Waylan Boga, PA-C  ondansetron (ZOFRAN) 4 MG tablet Take 1 tablet (4 mg total) by mouth every 6 (six) hours. 01/02/17   Emi Holes, PA-C   Family History History reviewed. No  pertinent family history.  Social History Social History  Substance Use Topics  . Smoking status: Current Some Day Smoker    Packs/day: 0.25    Types: Cigarettes  . Smokeless tobacco: Never Used  . Alcohol use No   Allergies   Patient has no known allergies.  Review of Systems Review of Systems  Constitutional: Negative for fever.  Gastrointestinal: Positive for abdominal pain (lower) and nausea. Negative for vomiting.  Genitourinary: Positive for dysuria and vaginal bleeding.  All other systems reviewed and are negative.  Physical Exam Updated Vital Signs BP (!) 117/104 (BP Location: Right Arm)   Pulse 82   Temp 98.9 F (37.2 C) (Oral)   Resp 18   Ht 5\' 11"  (1.803 m)   Wt 289 lb (131.1 kg)   LMP 02/17/2017   SpO2 100%   BMI 40.31 kg/m   Physical Exam  Constitutional: She appears well-developed and well-nourished. No distress.  HENT:  Head: Normocephalic and atraumatic.  Eyes: Conjunctivae are normal.  Neck: Normal range of motion.  Cardiovascular: Normal rate, regular rhythm and normal heart sounds.   No murmur heard. Pulmonary/Chest: Effort normal and breath sounds normal. No respiratory distress. She has no wheezes. She has no rales.  Abdominal: Soft. She exhibits no distension. There is no tenderness. There is no rigidity, no rebound, no guarding and no CVA tenderness.  Abdomen soft, non-tender,  no rebound or guarding, normal bowel sounds  Musculoskeletal: Normal range of motion.  Neurological: She is alert.  Skin: No pallor.  Psychiatric: She has a normal mood and affect. Her behavior is normal.  Nursing note and vitals reviewed.  ED Treatments / Results  DIAGNOSTIC STUDIES: Oxygen Saturation is 100% on RA, normal by my interpretation.   COORDINATION OF CARE: 12:15 PM-Discussed next steps with pt. Pt verbalized understanding and is agreeable with the plan.   Labs (all labs ordered are listed, but only abnormal results are displayed) Labs Reviewed    CBC WITH DIFFERENTIAL/PLATELET - Abnormal; Notable for the following:       Result Value   Hemoglobin 11.9 (*)    All other components within normal limits  BASIC METABOLIC PANEL - Abnormal; Notable for the following:    Glucose, Bld 104 (*)    All other components within normal limits  I-STAT BETA HCG BLOOD, ED ([facility], [facility], [facility] ONLY)  GC/CHLAMYDIA PROBE AMP ([place]) NOT AT [facility]   Radiology No results found.  Procedures Procedures   Medications Ordered in ED Medications - No data to display  Initial Impression / Assessment and Plan / ED Course  I have reviewed the triage vital signs and the nursing notes.  Pertinent labs & imaging results that were available during my care of the patient were reviewed by me and considered in my medical decision making (see chart for details).  36 y.o. F here with vaginal bleeding.  Reports she is [redacted] wks pregnant, LMP in may.  Reports G2P1.  States bleeding and abdominal cramping started this morning.  Reports some nausea as well for the past few days.  Abdomen soft, non-tender. Labs thus far overall reassuring.   hcg here is negative.  Patient states she only took 1 home pregnancy test several weeks ago.  Has not taken any further testing/imaging and has not seen her OB-GYN.  Question if this was missed abortion versus chemical pregnancy. Will plan for pelvic exam +/- ultrasound.  12:59 PM  Went back into reassess patient and gown was in the bed, all belongings were gone.  Had planned for pelvic exam, possible ultrasound which was explained to patient during my first evaluation.  She did not tell myself nor nursing staff that she was leaving.   Patient's vitals were stable and she did not appear in any distress during my initial evaluation. Patient eloped.   Final Clinical Impressions(s) / ED Diagnoses   Final diagnoses:  Vaginal bleeding   New Prescriptions Discharge Medication List as of 05/11/2017  1:05 PM     I personally performed the  services described in this documentation, which was scribed in my presence. The recorded information has been reviewed and is accurate.    Garlon HatchetSanders, Helton Oleson M, PA-C 05/11/17 1410    Tilden Fossaees, Elizabeth, MD 05/12/17 1038

## 2017-09-06 ENCOUNTER — Ambulatory Visit: Payer: Medicare Other | Admitting: General Practice

## 2017-09-07 ENCOUNTER — Ambulatory Visit (INDEPENDENT_AMBULATORY_CARE_PROVIDER_SITE_OTHER): Payer: Medicare Other | Admitting: General Practice

## 2017-09-07 DIAGNOSIS — Z3202 Encounter for pregnancy test, result negative: Secondary | ICD-10-CM | POA: Diagnosis present

## 2017-09-07 LAB — POCT PREGNANCY, URINE: Preg Test, Ur: NEGATIVE

## 2017-09-07 NOTE — Progress Notes (Signed)
Patient here for UPT today. UPT -. Patient reports two positive home tests but test negative. Patient was relieved with results & had no questions

## 2017-09-08 NOTE — Progress Notes (Signed)
Agree w/ POC.  Katrinka BlazingSmith, [place], CNM 09/08/2017 10:59 AM

## 2017-09-10 IMAGING — CT CT ABD-PELV W/ CM
2 of 4 series · 17 of 46 positions shown, 19 images · IV contrast (omnipaque)
Comparison: None.

CLINICAL DATA: Acute generalized abdominal pain.

EXAM:
CT ABDOMEN AND PELVIS WITH CONTRAST
TECHNIQUE: Multidetector CT imaging of the abdomen and pelvis was performed
using the standard protocol following bolus administration of
intravenous contrast.
CONTRAST:  100mL OMNIPAQUE IOHEXOL 300 MG/ML  SOLN

[Series 2: abd_pel_with 5.0 b40s · axial · 0.98mm/px · z∈[-486,-42]mm · 14 of 97 slices shown, 16 images]
[im 4/97  soft-tissue]
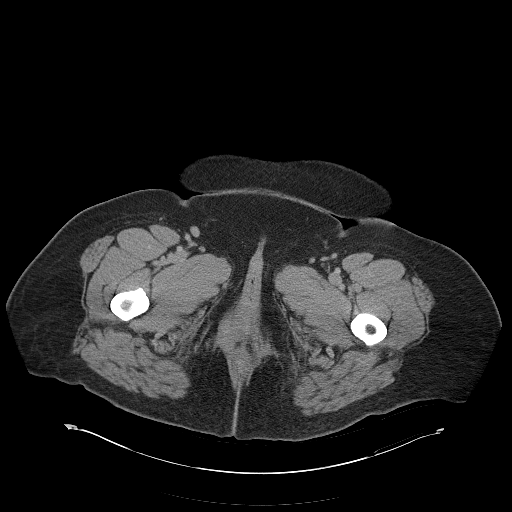
[im 4/97  bone]
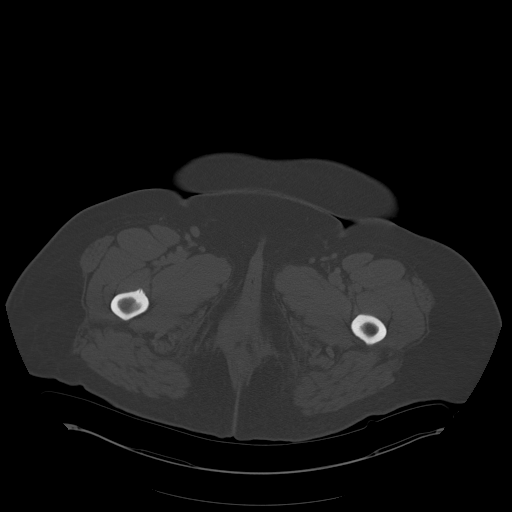
[im 12/97  soft-tissue]
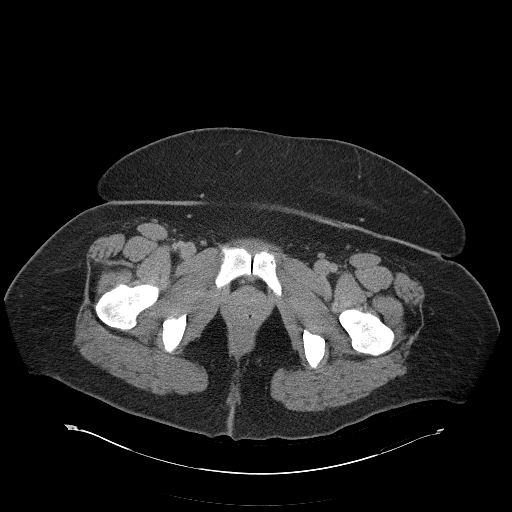
[im 20/97  soft-tissue]
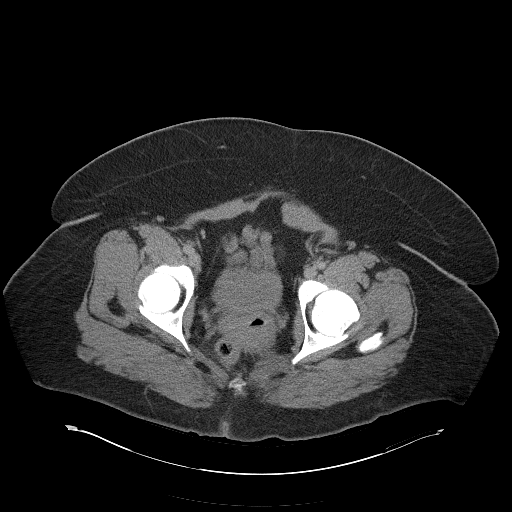
[im 27/97  soft-tissue]
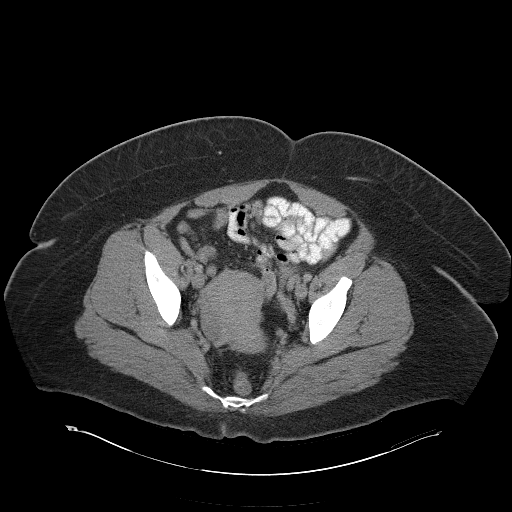
[im 31/97  soft-tissue]
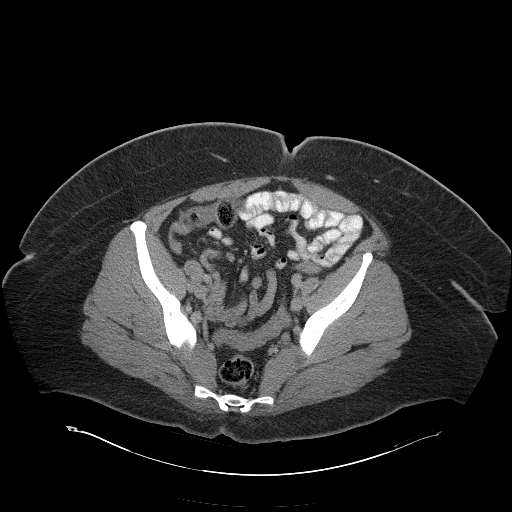
[im 39/97  soft-tissue]
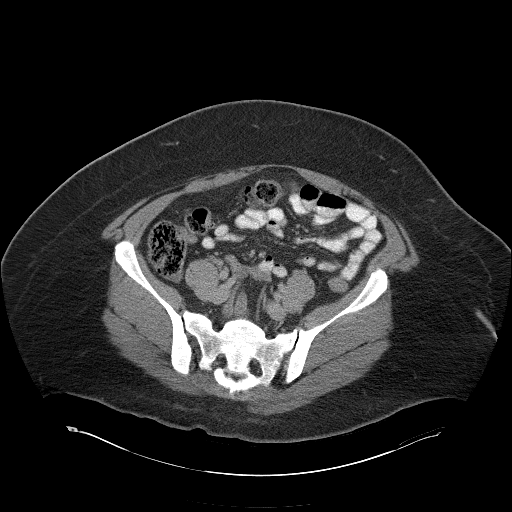
[im 47/97  soft-tissue]
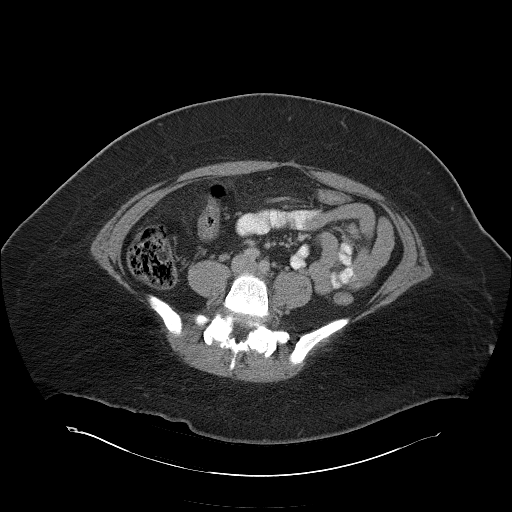
[im 50/97  soft-tissue]
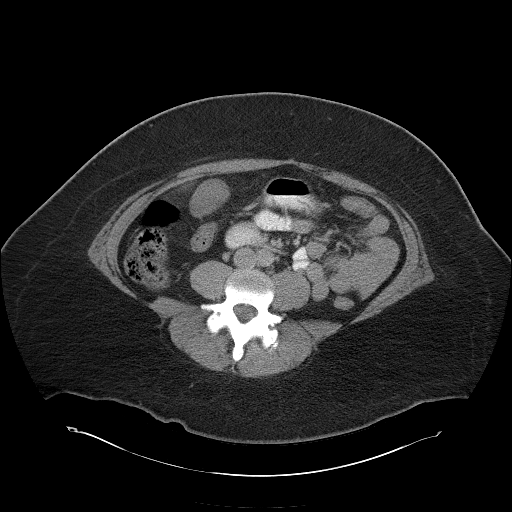
[im 58/97  soft-tissue]
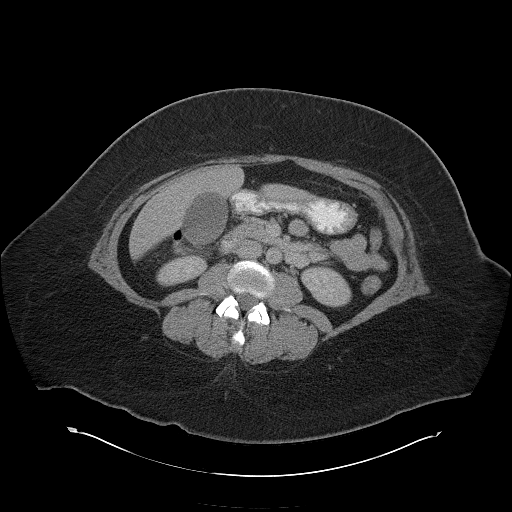
[im 58/97  bone]
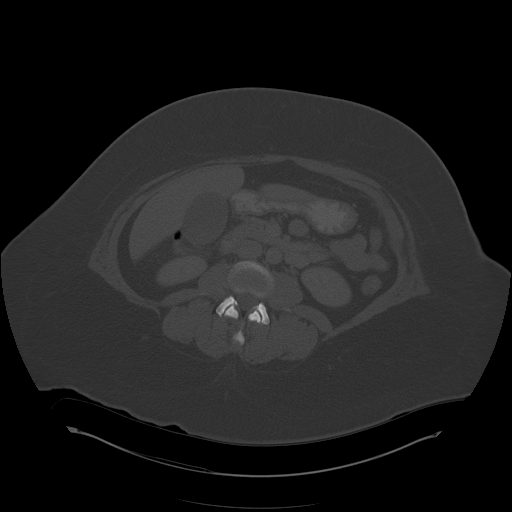
[im 66/97  soft-tissue]
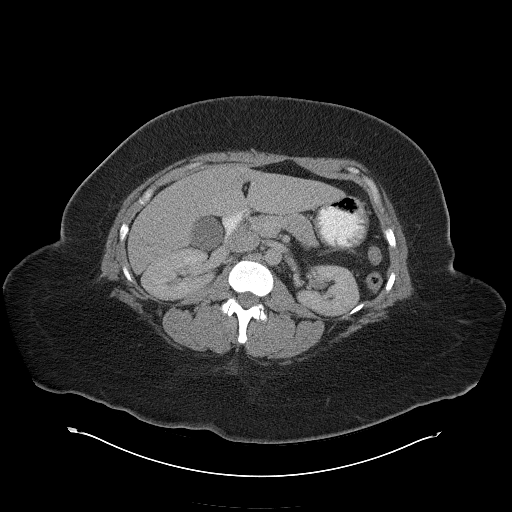
[im 73/97  soft-tissue]
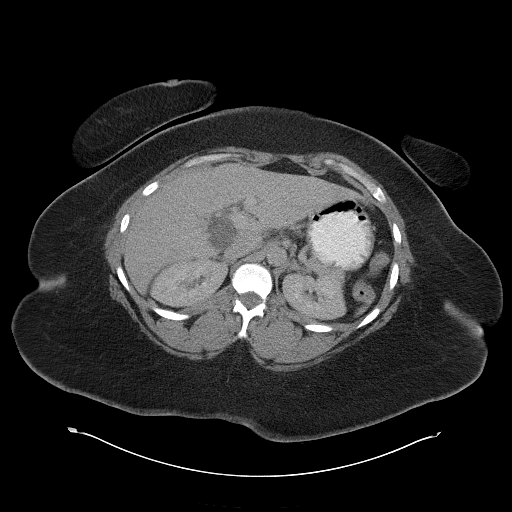
[im 77/97  soft-tissue]
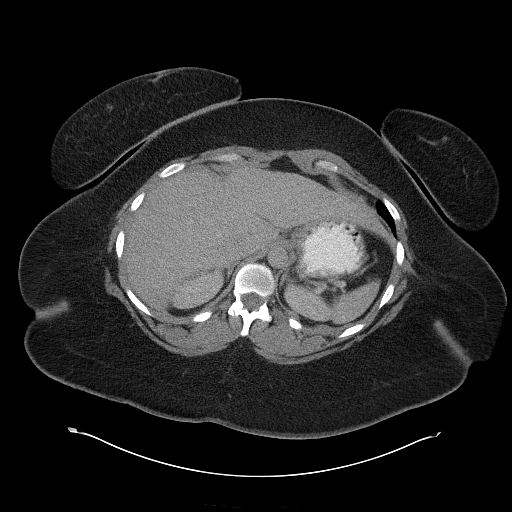
[im 85/97  soft-tissue]
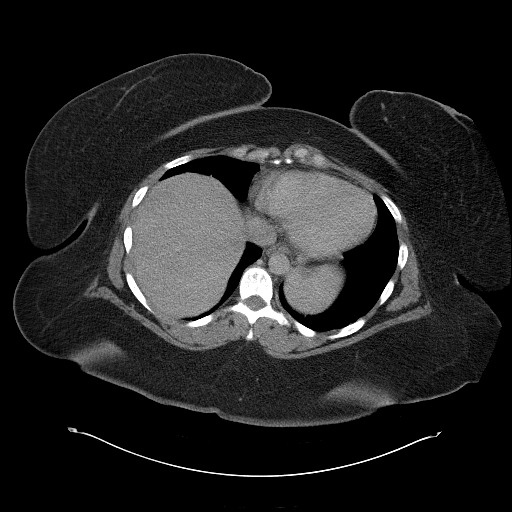
[im 93/97  soft-tissue]
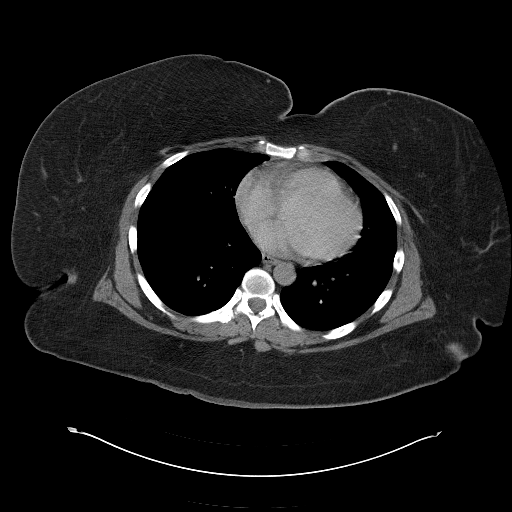

[Series 4: mpr cor post contrast (id) · coronal · 0.92mm/px · 3 of 110 slices shown]
[im 37/110  soft-tissue]
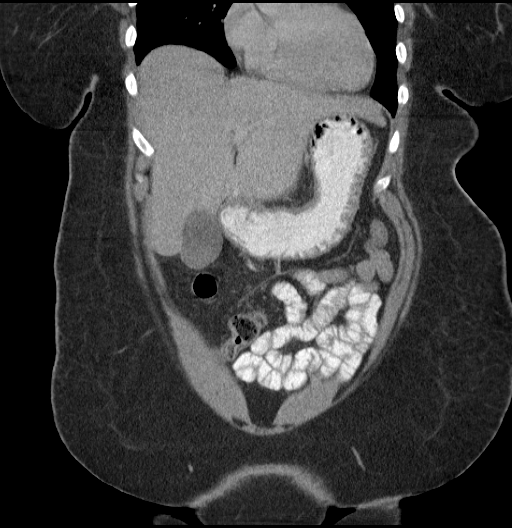
[im 49/110  soft-tissue]
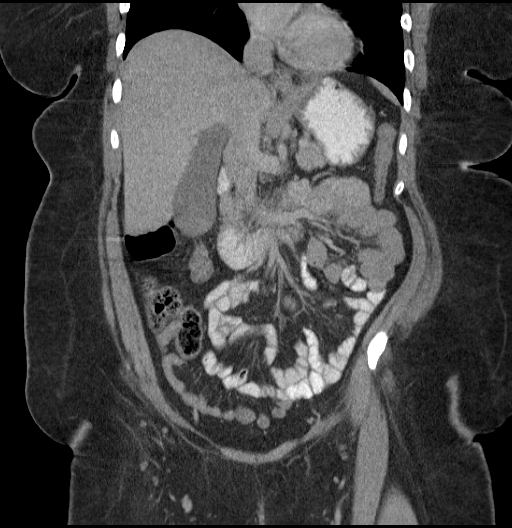
[im 61/110  soft-tissue]
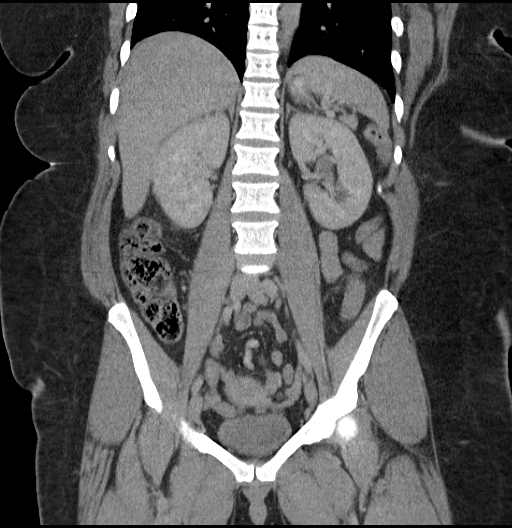

[17 of 46 positions shown; findings below may reference images not displayed]

FINDINGS: Visualized lung bases are unremarkable. No significant osseous
abnormality is noted.

Probable sludge is noted in gallbladder. No definite stones are
noted. The liver, spleen and pancreas are unremarkable. Adrenal
glands and kidneys appear normal. No hydronephrosis or renal
obstruction is noted. No renal or ureteral calculi are noted. The
appendix appears normal. There is no evidence of bowel obstruction.
No abnormal fluid collection is noted. Uterus and ovaries are
unremarkable. No abnormal fluid collection is noted. Urinary bladder
is unremarkable. Small fat containing periumbilical hernia is noted.
No significant adenopathy is noted.
IMPRESSION: Probable sludge is noted within the gallbladder, but ultrasound may
be performed for further evaluation. No definite stones are noted.

Small fat containing periumbilical hernia is noted.

No other definite abnormality seen in the abdomen or pelvis.

## 2018-10-25 IMAGING — DX DG CHEST 2V
2 series · 2 of 2 positions shown · non-contrast
Comparison: None.

CLINICAL DATA: Shortness of Breath

EXAM:
CHEST  2 VIEW

[chest pa]
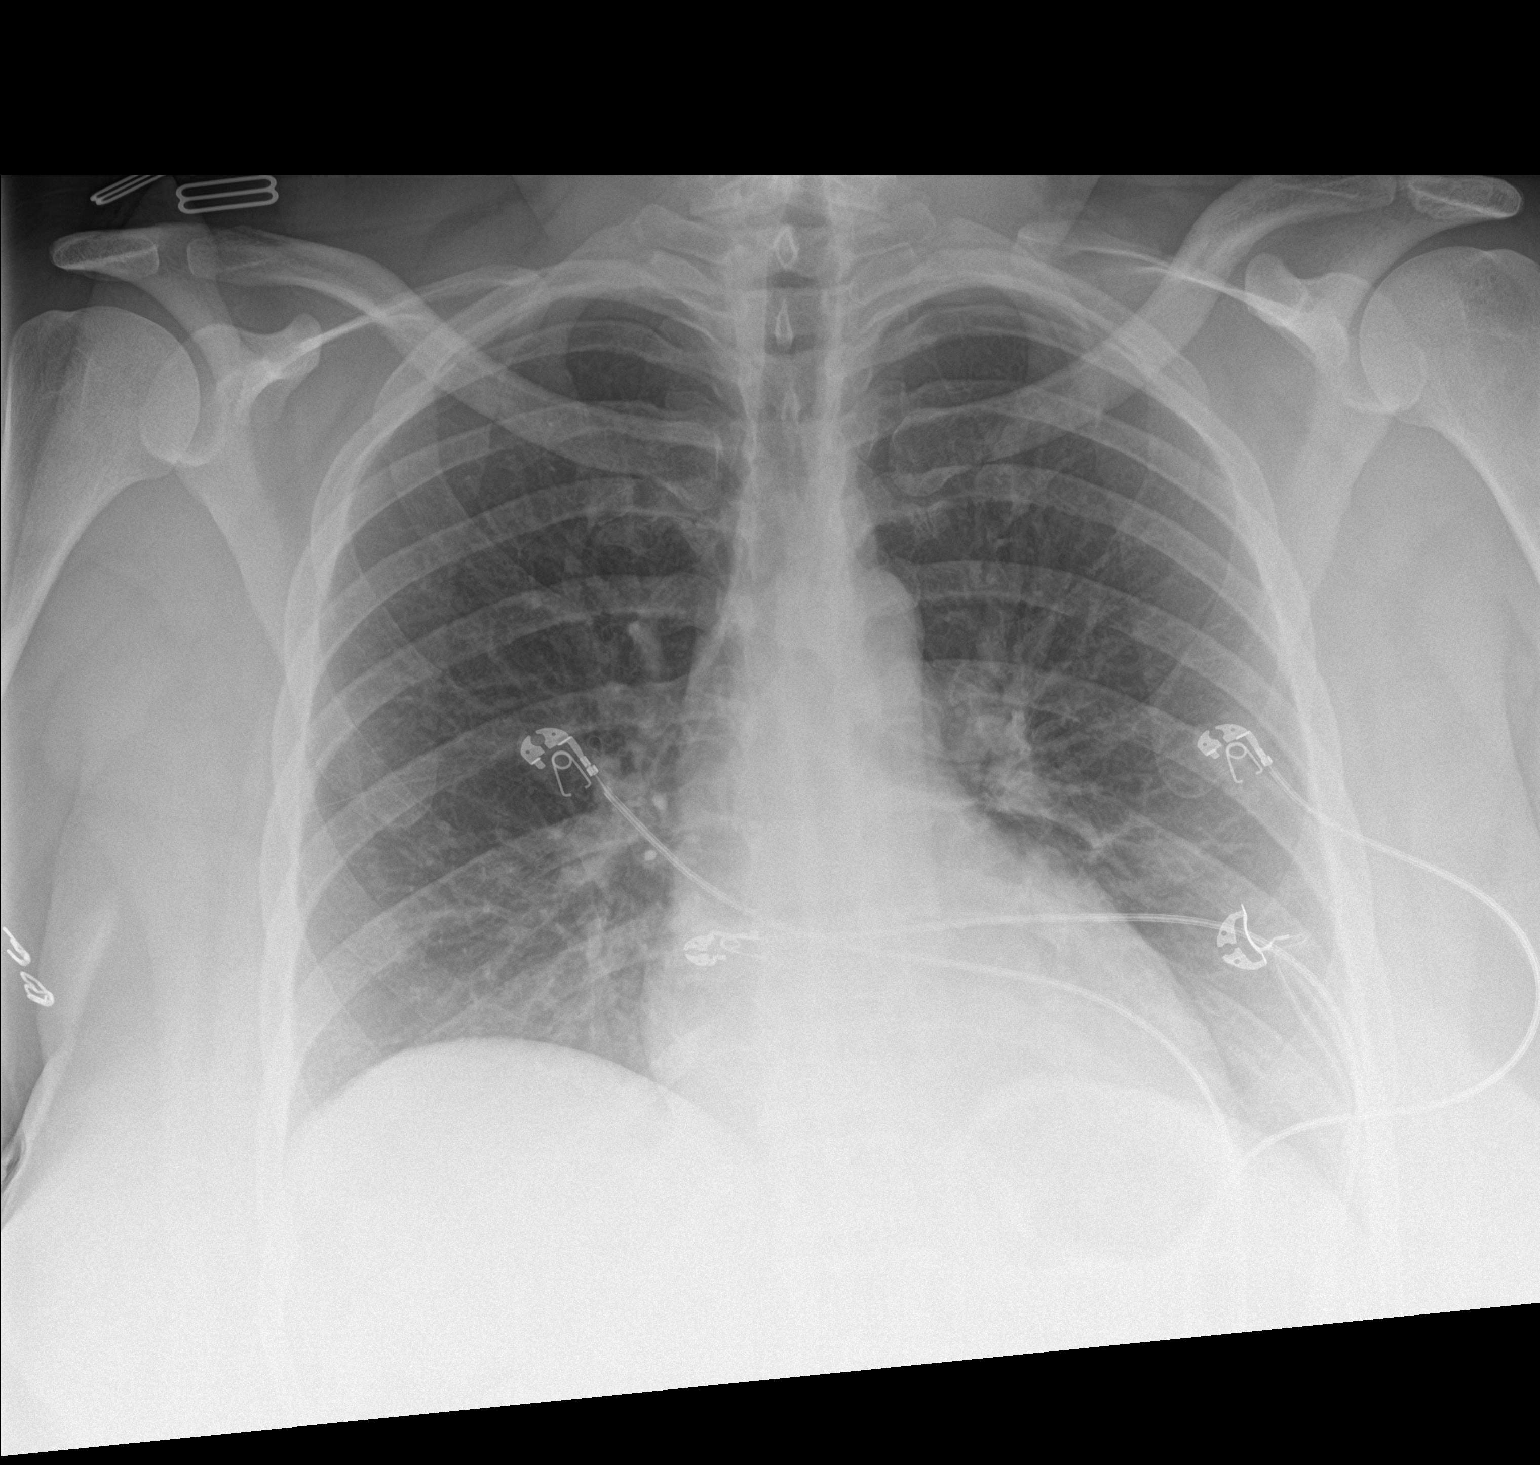

[chest lat]
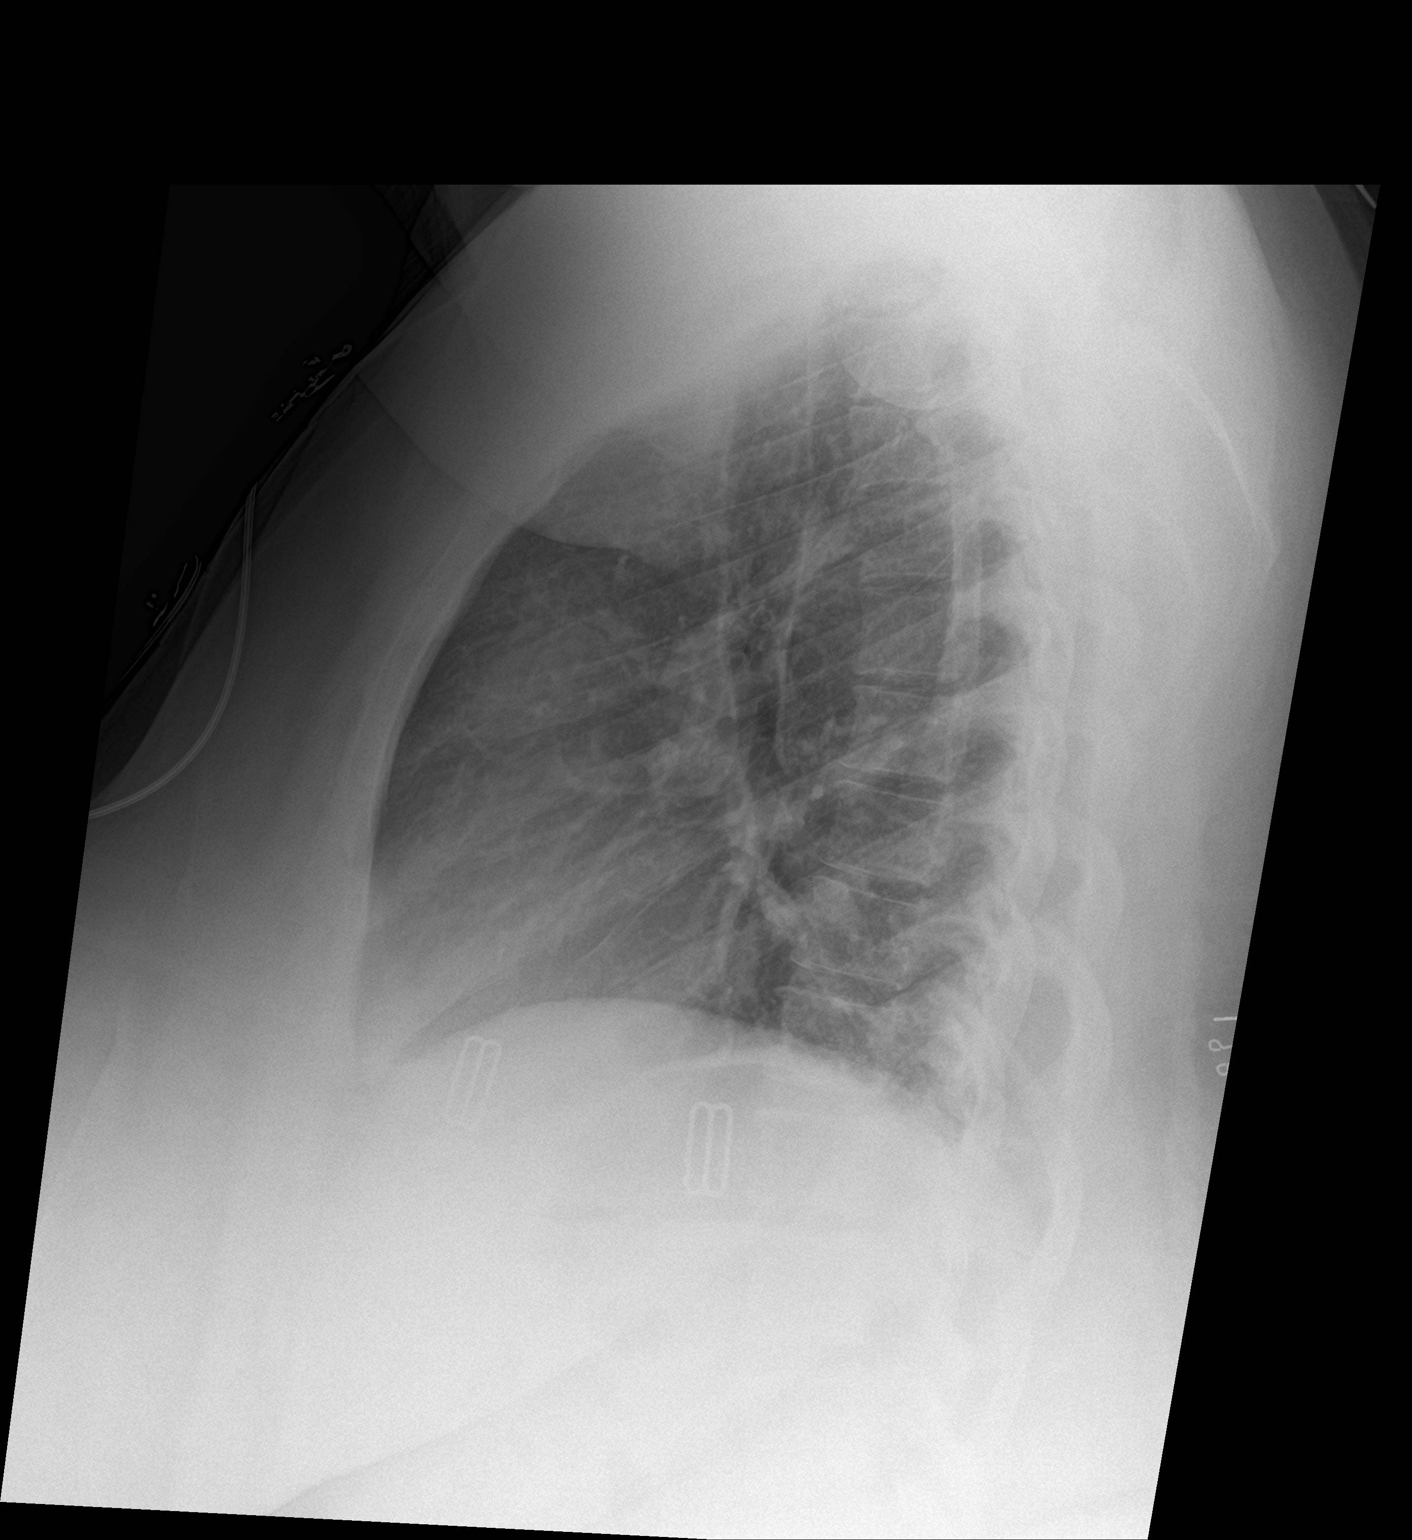

[2 of 2 positions shown; findings below may reference images not displayed]

FINDINGS: The heart size and mediastinal contours are within normal limits.
Both lungs are clear. The visualized skeletal structures are
unremarkable.
IMPRESSION: No active cardiopulmonary disease.
